# Patient Record
Sex: Male | Born: 1940 | Marital: Single
Health system: Southern US, Community
[De-identification: ages and names within clinical notes are randomized; demographics above are authoritative.]

---

## 2005-05-09 ENCOUNTER — Other Ambulatory Visit: Payer: Self-pay

## 2005-05-09 ENCOUNTER — Emergency Department: Payer: Self-pay | Admitting: Emergency Medicine

## 2009-07-28 ENCOUNTER — Ambulatory Visit: Payer: Self-pay | Admitting: Ophthalmology

## 2009-10-06 ENCOUNTER — Ambulatory Visit: Payer: Self-pay | Admitting: Ophthalmology

## 2010-04-24 ENCOUNTER — Ambulatory Visit: Payer: Self-pay | Admitting: Internal Medicine

## 2012-06-16 ENCOUNTER — Emergency Department: Payer: Self-pay | Admitting: Emergency Medicine

## 2012-06-17 LAB — COMPREHENSIVE METABOLIC PANEL
Alkaline Phosphatase: 135 U/L (ref 50–136)
Bilirubin,Total: 0.4 mg/dL (ref 0.2–1.0)
Chloride: 83 mmol/L — ABNORMAL LOW (ref 98–107)
Co2: 29 mmol/L (ref 21–32)
Creatinine: 1.18 mg/dL (ref 0.60–1.30)
EGFR (African American): >60
EGFR (Non-African Amer.): >60
Glucose: 102 mg/dL — ABNORMAL HIGH (ref 65–99)
SGOT(AST): 10 U/L — ABNORMAL LOW (ref 15–37)
Total Protein: 7.4 g/dL (ref 6.4–8.2)

## 2012-06-17 LAB — CBC
MCHC: 34 g/dL (ref 32.0–36.0)
RBC: 4.33 10*6/uL — ABNORMAL LOW (ref 4.40–5.90)
RDW: 17 % — ABNORMAL HIGH (ref 11.5–14.5)
WBC: 12.1 10*3/uL — ABNORMAL HIGH (ref 3.8–10.6)

## 2012-06-17 LAB — TROPONIN I: Troponin-I: <0.02 ng/mL

## 2012-06-17 LAB — PRO B NATRIURETIC PEPTIDE: B-Type Natriuretic Peptide: 2429 pg/mL — ABNORMAL HIGH (ref 0–125)

## 2013-04-27 ENCOUNTER — Inpatient Hospital Stay: Payer: Self-pay | Admitting: Internal Medicine

## 2013-04-27 LAB — URINALYSIS, COMPLETE
Bacteria: NONE SEEN
Bilirubin,UR: NEGATIVE
Blood: NEGATIVE
Glucose,UR: NEGATIVE mg/dL (ref 0–75)
Ketone: NEGATIVE
Leukocyte Esterase: NEGATIVE
Nitrite: NEGATIVE
Ph: 7 (ref 4.5–8.0)
Protein: NEGATIVE
RBC,UR: <1 /HPF (ref 0–5)
Specific Gravity: 1.005 (ref 1.003–1.030)
Squamous Epithelial: <1

## 2013-04-27 LAB — CBC
HCT: 38.7 % — ABNORMAL LOW (ref 40.0–52.0)
HGB: 13.1 g/dL (ref 13.0–18.0)
MCH: 31.8 pg (ref 26.0–34.0)
MCHC: 33.7 g/dL (ref 32.0–36.0)
MCV: 94 fL (ref 80–100)
Platelet: 337 10*3/uL (ref 150–440)
RBC: 4.11 10*6/uL — ABNORMAL LOW (ref 4.40–5.90)
RDW: 16.3 % — ABNORMAL HIGH (ref 11.5–14.5)
WBC: 5.1 10*3/uL (ref 3.8–10.6)

## 2013-04-27 LAB — COMPREHENSIVE METABOLIC PANEL
ALT: 23 U/L (ref 12–78)
ANION GAP: 5 — AB (ref 7–16)
Albumin: 2.9 g/dL — ABNORMAL LOW (ref 3.4–5.0)
Alkaline Phosphatase: 113 U/L
BUN: 10 mg/dL (ref 7–18)
Bilirubin,Total: 0.4 mg/dL (ref 0.2–1.0)
CALCIUM: 9.2 mg/dL (ref 8.5–10.1)
Chloride: 99 mmol/L (ref 98–107)
Co2: 28 mmol/L (ref 21–32)
Creatinine: 1.1 mg/dL (ref 0.60–1.30)
EGFR (African American): >60
EGFR (Non-African Amer.): >60
GLUCOSE: 100 mg/dL — AB (ref 65–99)
Osmolality: 264 (ref 275–301)
Potassium: 4 mmol/L (ref 3.5–5.1)
SGOT(AST): 26 U/L (ref 15–37)
SODIUM: 132 mmol/L — AB (ref 136–145)
TOTAL PROTEIN: 7 g/dL (ref 6.4–8.2)

## 2013-04-27 LAB — TROPONIN I: Troponin-I: <0.02 ng/mL

## 2013-04-27 LAB — CK TOTAL AND CKMB (NOT AT ARMC)
CK, Total: 48 U/L
CK-MB: 0.9 ng/mL (ref 0.5–3.6)

## 2013-04-27 LAB — CK-MB: CK-MB: 1.1 ng/mL (ref 0.5–3.6)

## 2013-04-27 LAB — LIPASE, BLOOD: Lipase: 145 U/L (ref 73–393)

## 2013-04-28 ENCOUNTER — Ambulatory Visit: Payer: Self-pay | Admitting: Oncology

## 2013-04-28 DIAGNOSIS — I517 Cardiomegaly: Secondary | ICD-10-CM

## 2013-04-28 LAB — PROTIME-INR
INR: 1.1
PROTHROMBIN TIME: 14.1 s (ref 11.5–14.7)

## 2013-04-28 LAB — COMPREHENSIVE METABOLIC PANEL
AST: 19 U/L (ref 15–37)
Albumin: 2.5 g/dL — ABNORMAL LOW (ref 3.4–5.0)
Alkaline Phosphatase: 96 U/L
Anion Gap: 7 (ref 7–16)
BILIRUBIN TOTAL: 0.4 mg/dL (ref 0.2–1.0)
BUN: 11 mg/dL (ref 7–18)
CALCIUM: 8.9 mg/dL (ref 8.5–10.1)
CO2: 28 mmol/L (ref 21–32)
Chloride: 100 mmol/L (ref 98–107)
Creatinine: 1.24 mg/dL (ref 0.60–1.30)
EGFR (African American): >60
GFR CALC NON AF AMER: 57 — AB
Glucose: 98 mg/dL (ref 65–99)
OSMOLALITY: 269 (ref 275–301)
Potassium: 3.9 mmol/L (ref 3.5–5.1)
SGPT (ALT): 18 U/L (ref 12–78)
SODIUM: 135 mmol/L — AB (ref 136–145)
Total Protein: 6.5 g/dL (ref 6.4–8.2)

## 2013-04-28 LAB — CBC WITH DIFFERENTIAL/PLATELET
BASOS PCT: 0.4 %
Basophil #: 0 10*3/uL (ref 0.0–0.1)
Eosinophil #: 0 10*3/uL (ref 0.0–0.7)
Eosinophil %: 0.3 %
HCT: 37.1 % — ABNORMAL LOW (ref 40.0–52.0)
HGB: 12 g/dL — ABNORMAL LOW (ref 13.0–18.0)
LYMPHS ABS: 1.5 10*3/uL (ref 1.0–3.6)
Lymphocyte %: 21.9 %
MCH: 30.5 pg (ref 26.0–34.0)
MCHC: 32.4 g/dL (ref 32.0–36.0)
MCV: 94 fL (ref 80–100)
MONOS PCT: 35.7 %
Monocyte #: 2.5 x10 3/mm — ABNORMAL HIGH (ref 0.2–1.0)
NEUTROS ABS: 2.9 10*3/uL (ref 1.4–6.5)
Neutrophil %: 41.7 %
PLATELETS: 349 10*3/uL (ref 150–440)
RBC: 3.94 10*6/uL — AB (ref 4.40–5.90)
RDW: 16.9 % — ABNORMAL HIGH (ref 11.5–14.5)
WBC: 6.9 10*3/uL (ref 3.8–10.6)

## 2013-04-28 LAB — PRO B NATRIURETIC PEPTIDE: B-TYPE NATIURETIC PEPTID: 936 pg/mL — AB (ref 0–125)

## 2013-05-04 ENCOUNTER — Ambulatory Visit: Payer: Self-pay | Admitting: Oncology

## 2013-10-04 DEATH — deceased

## 2014-06-27 NOTE — Consult Note (Signed)
History of Present Illness:  Reason for Consult Stage IV lung cancer, actively receiving chemotherapy at the [place] in [place].   HPI   Patient is a 74 year old male with past medical history significant for stage IV lung cancer was actively receiving chemotherapy. He presented to the with a several day history of right flank pain. Subsequent CT suggested cholecystitis. Given his multiple comorbidities, it was elected that he be treated with conservative management. Currently, he feels improved and nearly back to his baseline. He has no neurologic complaints. He denies any fevers. He denies any chest pain or shortness of breath. He denies any further abdominal pain, and denies any nausea, vomiting, constipation, or diarrhea. He has no urinary complaints. Patient otherwise feels well and offers no further specific complaints.  PFSH:  Additional Past Medical and Surgical History stage IV lung cancer, CAD status post MI, COPD, hyperlipidemia, hypertension, BPH, traumatic left BKA.  Social history: Remote tobacco, denies alcohol.  Family history: CVA, CAD, hypertension.   Review of Systems:  Performance Status (ECOG) 2   Review of Systems   As per history of present illness, otherwise a 10 system review is negative.  NURSING NOTES: **Vital Signs.:   23-Feb-15 15:49   Vital Signs Type: Q 4hr   Temperature Temperature (F): 98   Celsius: 36.6   Temperature Source: oral   Pulse Pulse: 95   Respirations Respirations: 20   Systolic BP Systolic BP: 93   Diastolic BP (mmHg) Diastolic BP (mmHg): 60   Mean BP: 71   Pulse Ox % Pulse Ox %: 89   Pulse Ox Activity Level: At rest   Oxygen Delivery: 2L   Physical Exam:  Physical Exam General: Well-developed, well-nourished, no acute distress. Eyes: Pink conjunctiva, anicteric sclera. HEENT: Normocephalic, moist mucous membranes, clear oropharnyx. Lungs: Clear to auscultation bilaterally. Heart: Regular rate and rhythm. No rubs,  murmurs, or gallops. Abdomen: Soft, nontender, nondistended. No organomegaly noted, normoactive bowel sounds. Musculoskeletal: left BKA Neuro: Alert, answering all questions appropriately. Cranial nerves grossly intact. Skin: No rashes or petechiae noted. Psych: Normal affect. Lymphatics: No cervical, calvicular, axillary or inguinal LAD.    No Known Allergies:     amLODIPine 10 mg oral tablet: 1 tab(s) orally once a day, Status: Active, Quantity: 0, Refills: None   Metoprolol Tartrate 50 mg oral tablet: 1 tab(s) orally 2 times a day, Status: Active, Quantity: 0, Refills: None   lisinopril 40 mg oral tablet: 1 tab(s) orally once a day, Status: Active, Quantity: 0, Refills: None   gabapentin 100 mg oral capsule: 1 cap(s) orally 3 times a day as needed for nerve pain., Status: Active, Quantity: 0, Refills: None   tamsulosin 0.4 mg oral capsule: 1 cap(s) orally once a day, Status: Active, Quantity: 0, Refills: None   omeprazole 20 mg oral delayed release capsule: 2 caps (45m) orally once a day to control stomach acid 30 minutes before a meal., Status: Active, Quantity: 0, Refills: None   simvastatin 40 mg oral tablet: 0.5 tab (26m orally once a day (at bedtime), Status: Active, Quantity: 0, Refills: None  Laboratory Results:  Hepatic:  23-Feb-15 03:57   Bilirubin, Total 0.4  Alkaline Phosphatase 96 (45-117 NOTE: New Reference Range 01/24/13)  SGPT (ALT) 18  SGOT (AST) 19  Total Protein, Serum 6.5  Albumin, Serum  2.5  Routine Chem:  23-Feb-15 03:57   B-Type Natriuretic Peptide ([facility])  936 (Result(s) reported on 28 Apr 2013 at 05:06AM.)  Result Comment MONO # - CONFIRMED BY MANUAL  DIFFERENTIAL  Result(s) reported on 28 Apr 2013 at 05:43AM.  Glucose, Serum 98  BUN 11  Creatinine (comp) 1.24  Sodium, Serum  135  Potassium, Serum 3.9  Chloride, Serum 100  CO2, Serum 28  Calcium (Total), Serum 8.9  Osmolality (calc) 269  eGFR (African American) >60  eGFR (Non-African  American)  57 (eGFR values <31m/min/1.73 m2 may be an indication of chronic kidney disease (CKD). Calculated eGFR is useful in patients with stable renal function. The eGFR calculation will not be reliable in acutely ill patients when serum creatinine is changing rapidly. It is not useful in  patients on dialysis. The eGFR calculation may not be applicable to patients at the low and high extremes of body sizes, pregnant women, and vegetarians.)  Anion Gap 7  Routine Coag:  23-Feb-15 03:57   Prothrombin 14.1  INR 1.1 (INR reference interval applies to patients on anticoagulant therapy. A single INR therapeutic range for coumarins is not optimal for all indications; however, the suggested range for most indications is 2.0 - 3.0. Exceptions to the INR Reference Range may include: Prosthetic heart valves, acute myocardial infarction, prevention of myocardial infarction, and combinations of aspirin and anticoagulant. The need for a higher or lower target INR must be assessed individually. Reference: The Pharmacology and Management of the Vitamin K  antagonists: the seventh ACCP Conference on Antithrombotic and Thrombolytic Therapy. CYEBXI.3568Sept:126 (3suppl): [id number] A HCT value >55% may artifactually increase the PT.  In one study,  the increase was an average of 25%. Reference:  "Effect on Routine and Special Coagulation Testing Values of Citrate Anticoagulant Adjustment in Patients with High HCT Values." American Journal of Clinical Pathology 2006;126:400-405.)  Routine Hem:  23-Feb-15 03:57   WBC (CBC) 6.9  RBC (CBC)  3.94  Hemoglobin (CBC)  12.0  Hematocrit (CBC)  37.1  Platelet Count (CBC) 349  MCV 94  MCH 30.5  MCHC 32.4  RDW  16.9  Neutrophil % 41.7  Lymphocyte % 21.9  Monocyte % 35.7  Eosinophil % 0.3  Basophil % 0.4  Neutrophil # 2.9  Lymphocyte # 1.5  Monocyte #  2.5  Eosinophil # 0.0  Basophil # 0.0   Assessment and Plan: Impression:   Stage IV lung  cancer on chemotherapy, now with cholecystitis. Plan:   1. Stage IV lung cancer: Patient states he received single agent treatment approximately 2 weeks ago, but is unclear as to what he is receiving. Patient states his next treatment is in approximately one week. He has been instructed to keep thisappointment as scheduled, but is unlikely he will be able to receive treatment at this visit. Will defer that decision to [facility] No intervention is needed at this time.Cholecystitis: Given the palliative nature of treatment, agree with conservative management. consult, call questions.  Electronic Signatures: FDelight Hoh(MD)  (Signed [id number])  Authored: HISTORY OF PRESENT ILLNESS, PFSH, ROS, NURSING NOTES, PE, ALLERGIES, HOME MEDICATIONS, LABS, ASSESSMENT AND PLAN   Last Updated: 24-Feb-15 08:50 by FDelight Hoh(MD)

## 2014-06-27 NOTE — Consult Note (Signed)
PATIENT NAME:  Jordan Hurst MR#:  [id number] DATE OF BIRTH:  [birth year]  DATE OF CONSULTATION:  04/27/2013  REFERRING PHYSICIAN:   CONSULTING PHYSICIAN:  Carmie Endalph L. Ely III, MD  [facility].   ADMITTING DIAGNOSIS: Respiratory insufficiency.   SURGICAL DIAGNOSIS: Abdominal pain, possible cholecystitis.   ADMITTING PHYSICIAN: [facility].    BRIEF HISTORY: Jordan Hurst is a 74 year old gentleman admitted through the Emergency Room with abdominal right upper quadrant, right chest pain. The patient has stage IV lung cancer, currently [facility]. He also has a history of significant chronic obstructive lung disease and coronary artery disease. He has noted some increasing discomfort in the upper abdomen and right chest area. He said the pain has been going on for several days, dating back to his last chemotherapy treatment last week in [place]. He said it worsened over the last 24 hours. He has not had any significant vomiting, although he has been mildly nauseated. He is not sure whether the nausea is related to his chemotherapy or to his abdominal and chest discomfort. He has had multiple similar episodes in the past which have not been as severe. He has not had an episode with the symptoms that he has had currently. Denies any other significant GI problems. He denies a history of hepatitis, yellow jaundice, pancreatitis, peptic ulcer disease, previous diagnosis of gallbladder disease or diverticulitis. He does have a history of atherosclerotic coronary artery disease with a previous MI approximately 8 to 10 years ago. He also has a history of chronic obstructive lung disease, hypertension, hyperlipidemia. Surgical history is significant for a traumatic left below-knee amputation. Denies any abdominal surgery. The remainder of his workup is well outlined in his medical admission note.   He does not smoke  cigarettes currently but does have a history of previous tobacco usage. He does not have any history of alcohol problems.   REVIEW OF SYSTEMS: Outlined in his medical note.   FAMILY HISTORY: Noncontributory.   PHYSICAL EXAMINATION:  GENERAL: He seems alert, relatively comfortable, although he had recently received some sedation just prior to my interview.  VITAL SIGNS: He is afebrile. His blood pressure is 130/70. Heart rate is 80 and regular. His oxygen saturation is stable on 2 liters at 95%.  HEENT: No scleral icterus. No pupillary abnormalities.  NECK: Supple, nontender with a midline trachea. No adenopathy.  CHEST: Clear with no adventitious sounds. He has normal pulmonary excursion.  CARDIAC: No murmurs or gallops. He seems to be in normal sinus rhythm. He has very distant cardiac sounds.  ABDOMEN: Mildly tender in left upper quadrant with some guarding but no rebound. I cannot palpate his gallbladder. The abdomen is otherwise soft, mildly distended.  EXTREMITIES: Surgically absent left leg. He does not have good distal pulses on the right side.  PSYCHIATRIC: Some mild sedation but relatively normal orientation.   RADIOGRAPHS: I independently reviewed his CT scan. CT scan does demonstrate some mild gallbladder wall thickening and a little bit of pericholecystic fluid consistent with a possible early cholecystitis, with his ultrasound demonstrating stones.   ASSESSMENT AND PLAN: He is a very poor surgical candidate with his underlying stage IV lung cancer. We would recommend continued intravenous antibiotic therapy and further observation. He may be a candidate for percutaneous cholecystostomy tube if his symptoms do not resolve. We will continue to follow him while he is hospitalized.   ____________________________ Quentin Orealph L. Ely III, MD rle:gb Hurst: 04/27/2013 20:41:18  ET T: 04/28/2013 01:45:18 ET JOB#: [id number]  cc: Carmie End, MD, <Dictator> Quentin Ore MD ELECTRONICALLY SIGNED  05/07/2013 7:37

## 2014-06-27 NOTE — Discharge Summary (Signed)
PATIENT NAME:  Jordan Hurst, Jordan Hurst MR#:  [id number] DATE OF BIRTH:  1940/06/07  DATE OF ADMISSION:  04/27/2013  DATE OF DISCHARGE:  04/29/2013  DISCHARGE DIAGNOSES: 1.  Acute mild cholecystitis, medical management recommended, improving on antibiotics.  2.  [facility]  3.  Acute respiratory failure, now resolved.  4.  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 Echo showing normal LV function.  5.  Chronic obstructive pulmonary disease exacerbation, improving.  6.  Hyponatremia, resolved, likely due to poor p.o. intake.   SECONDARY DIAGNOSES:  1.  Stage IV lung cancer, on chemotherapy.  2.  Atherosclerotic cardiovascular disease, status post myocardial infarction. 3.  Chronic obstructive pulmonary disease. 4.  Hyperlipidemia.  5.  Hypertension.  6.  Benign prostatic hypertrophy.   CONSULTATIONS:  1.  Surgery, Dr. Michela Pitcher. 2.  Palliative Care, Dr. Harvie Junior.  3.  Oncology, Dr. Orlie Dakin.   PROCEDURES AND RADIOLOGY: A 2-D echocardiogram on February 23 showed normal LVEF, with a value of 55% to 60%. Impaired relaxation pattern of LV diastolic filling. Mild concentric LVH. Mild aortic valve sclerosis without stenosis.   Chest x-ray on February 23 showed patchy density in the left lower lobe, likely residual scarring.   Chest x-ray on February 22 showed cardiomegaly with vascular congestion.   CT scan of the abdomen and pelvis with contrast on February 22 showed cholelithiasis, mild acute cholecystitis, diffuse hepatic steatosis, moderately large hiatal hernia. Descending and sigmoid colon diverticulosis, without evidence of acute diverticulitis. Mild to moderate prostate gland, right inguinal hernia containing fat. A 3 cm mass in the left lower lobe of the lung. No evidence of metastatic disease in the abdomen or pelvis.   Abdominal ultrasound on February 22 showed gallstone and mild pericholecystic fluid.   MAJOR LABORATORY PANEL: UA on  admission was negative.   HISTORY AND SHORT HOSPITAL COURSE: The patient is a 74 year old male with above-mentioned medical problems, who was admitted for mild acute cholecystitis. He was also found to have acute respiratory failure, thought to be secondary to acute on chronic diastolic heart failure. Please see Dr. Margaretmary Eddy dictated history and physical for further details. The patient had some hyponatremia, which was thought to be secondary to poor p.o. intake, and was resolved with IV hydration. Surgical consultation was obtained with Dr. Michela Pitcher, who recommended medical management considering patient's poor clinical status with advanced malignancy. He would be a very poor surgical candidate, and recommended just a course of antibiotic, which patient was in agreement. Palliative Care consultation was obtained with Dr. Harvie Junior, who was also recommending same. Oncology consultation was obtained with Dr. Orlie Dakin, who recommended outpatient followup with patient's oncologist at [facility] for his stage IV lung cancer. The patient was feeling much better, and was close to his baseline on February 24, and was discharged home in stable condition. On the date of discharge, his vital signs were as follows: Temperature 97.6, heart rate 92 per minute, respirations 18 per minute, blood pressure 132/69 mmHg, he was saturating 98% on room air.   PERTINENT PHYSICAL EXAMINATION ON THE DATE OF DISCHARGE:  CARDIOVASCULAR: S1, S2 normal. No murmur, rubs, or gallop.  LUNGS: Clear to auscultation bilaterally. No wheezing, rales, rhonchi, or crepitation.  ABDOMEN: Soft, benign.  NEUROLOGIC: Nonfocal examination.  All other physical examination remained at baseline.  DISCHARGE MEDICATIONS: 1.  Amlodipine 10 mg p.o. daily.  2.  Metoprolol 50 mg p.o. b.i.d.  3.  Lisinopril 40 mg p.o. daily. 4.  Gabapentin 100 mg p.o. 3 times a day as needed.  5.  Tamsulosin 0.4 mg p.o. daily. 6.  Omeprazole 40 mg p.o. daily.  7.  Levaquin 500 mg p.o.  daily for 7 days.  8.  Flagyl 500 mg p.o. 3 times a day for 7 days.   DISCHARGE DIET: Low sodium.   DISCHARGE ACTIVITY: As tolerated.   DISCHARGE INSTRUCTIONS AND FOLLOWUP:  The patient was instructed to follow up with [facility] in 2 to 4 weeks. He will need follow up with his oncology [facility] in 1 to 2 weeks as scheduled for following week after discharge. He will need [facility] in 4 to 6 weeks.   Total time discharging this patient was 45 minutes.    ____________________________ Ellamae SiaVipul S. Sherryll BurgerShah, MD vss:mr D: 05/03/2013 17:12:00 ET T: 05/03/2013 18:05:23 ET JOB#: [id number]  cc: [facility] Quentin Orealph L. Ely III, MD Ned GraceNancy Phifer, MD Tollie Pizzaimothy J. Orlie DakinFinnegan, MD Thinh Cuccaro S. Sherryll BurgerShah, MD, <Dictator>      Ellamae SiaVIPUL S [facility] MD ELECTRONICALLY SIGNED 05/07/2013 21:23

## 2014-06-27 NOTE — H&P (Signed)
PATIENT NAME:  [name], Park D MR#:  [id number] DATE OF BIRTH:  [birth year]  DATE OF ADMISSION:  04/27/2013  REFERRING PHYSICIAN: Darien Ramusavid W. Kaminski, MD  [facility].   REASON FOR ADMISSION: Acute respiratory failure.   HISTORY OF PRESENT ILLNESS: The patient is a [age] year old male [facility] with stage 4 lung cancer, undergoing chemotherapy. He also has a history of coronary artery disease, COPD and traumatic left BKA.  Presents to the Emergency Room with a 1 to 2-day history of right-sided upper abdominal and right lower chest pain. He states he has a pressure sensation in his right upper abdomen. Has had some diarrhea. In the Emergency Room, the patient was noted to be hypoxic. CT of the abdomen suggested cholecystitis. Chest x-ray suggested heart failure. The patient denies history of heart failure although he has had previous MI. The patient continued to have significant right-sided abdominal pain in the Emergency Room and is now admitted for further evaluation.   PAST MEDICAL HISTORY: 1.  Stage 4 lung cancer, on chemotherapy.  2.  ASCVD, status MI. 3.  COPD.   4.  Hyperlipidemia.  5.  Benign hypertension.  6.  BPH.   7.  Traumatic left BKA.    MEDICATIONS: 1.  Zocor 40 mg p.o. daily.  2.  Omeprazole 40 mg p.o. daily.  3.  Lopressor 50 mg p.o. b.i.d.  4.  Zestril 40 mg p.o. daily.  5.  Neurontin 300 mg p.o. at bedtime.  6.  Proscar 5 mg p.o. daily.  7.  Norvasc 10 mg p.o. daily.   ALLERGIES: No known drug allergies.   SOCIAL HISTORY: The patient has a remote history of tobacco abuse but none recently. Denies alcohol abuse.   FAMILY HISTORY: Positive for stroke, coronary artery disease and hypertension. Negative for colon or prostate cancer.   REVIEW OF SYSTEMS: CONSTITUTIONAL: No fever or change in weight.  EYES: No blurred or double vision. No glaucoma.  ENT: No tinnitus or hearing loss. No nasal discharge or bleeding. No difficulty  swallowing.  RESPIRATORY: The patient has had cough and wheezing. No hemoptysis. No painful respiration.  CARDIOVASCULAR: No chest pain or orthopnea. No palpitations or syncope.  GASTROINTESTINAL: Denies nausea, vomiting although he has had diarrhea. Does have abdominal pain in the right upper quadrant.  GENITOURINARY: No dysuria or hematuria. No incontinence.  ENDOCRINE: No polyuria or polydipsia. No heat or cold intolerance.  HEMATOLOGIC: The patient denies anemia, easy bruising or bleeding.  LYMPHATIC: No swollen glands.  MUSCULOSKELETAL: The patient denies pain in his neck, shoulders, knees or hips. No gout.  NEUROLOGIC: No numbness or migraines. Denies stroke or seizures.  PSYCHIATRIC: The patient denies anxiety, insomnia or depression.   PHYSICAL EXAMINATION: GENERAL: The patient is chronically ill-appearing, in no acute distress.  VITAL SIGNS: Currently remarkable for a blood pressure of 108/59 with a heart rate of 79, respiratory rate of 25, temperature of 98.7. Sat was 88% on room air and is 95% on 2 liters.  HEENT: Normocephalic, atraumatic. Pupils equally round and reactive to light and accommodation. Extraocular movements are intact. Sclerae are anicteric. Conjunctivae are clear. Oropharynx is clear.   NECK: Supple without JVD. No adenopathy or thyromegaly is noted.  LUNGS: Revealed decreased breath sounds with basilar crackles. Scattered rhonchi. No wheezes. No dullness. Respiratory effort is normal.  CARDIAC: Regular rate and rhythm. Normal S1, S2. No significant rubs, murmurs or gallops. PMI is nondisplaced. Chest wall is nontender.  ABDOMEN: Distended and  tender in the right upper quadrant. Some guarding but no rebound. Normoactive bowel sounds. No organomegaly or masses were appreciated.  EXTREMITIES: Without clubbing, cyanosis or edema. Left BKA was noted. Pulses on the right were 2+ distally.  SKIN: Warm and dry without rash or lesions.  NEUROLOGIC: Cranial nerves II through  XII grossly intact. Deep tendon reflexes were symmetric. Motor and sensory examination is nonfocal.  PSYCHIATRIC: The patient who is alert and oriented to person, place, and time. He was cooperative and used good judgment.   LABORATORY, DIAGNOSTIC AND RADIOLOGICAL DATA:  EKG revealed normal sinus rhythm with an old inferior infarct. Chest x-ray revealed cardiomegaly with mild vascular congestion but no edema. CT of the abdomen and pelvis revealed cholelithiasis with CT findings consistent with early acute cholecystitis, also noted was a 3 cm mass in the left lower lung lobe. Glucose 100 with a BUN of 10, creatinine 1.10 and a sodium of 132 with a GFR of greater than 60. Troponin x 2 was less than 0.02. White count was 5.1 with a hemoglobin of 13.1.   ASSESSMENT: 1.  Acute cholecystitis.  2.  Stage 4 lung cancer.  3.  Acute respiratory failure.  4.  Mild congestive heart failure.  5.  Chronic obstructive pulmonary disease exacerbation.  6.  Hyponatremia.  7.  History of atherosclerotic cardiovascular disease, status post myocardial infarction.   PLAN: The patient will be admitted to telemetry as a no CODE BLUE, DO NOT RESUSCITATE, according to his own wishes. He will be started on IV antibiotics with oxygen, DuoNeb SVNs and given IV Lasix x 1. We will follow through serial cardiac enzymes and obtain an echocardiogram. We will follow up chest x-ray and routine labs in the morning. We will consult surgery for his cholecystitis. Will consult oncology for his lung cancer. We will consult pulmonology for his COPD and respiratory failure. Clear liquid diet for now. Further treatment and evaluation will depend upon the patient's progress.   TOTAL TIME SPENT ON THIS PATIENT: 50 minutes.    ____________________________ Duane Lope Judithann Sheen, MD jds:cs D: 04/27/2013 18:08:35 ET T: 04/27/2013 18:29:24 ET JOB#: [id number]  cc: Duane Lope. Judithann Sheen, MD, <Dictator> Emonni Depasquale Rodena Medin MD ELECTRONICALLY SIGNED  04/28/2013 12:01

## 2014-11-21 IMAGING — CR DG CHEST 2V
1 series · 2 of 2 positions shown · non-contrast
Comparison: 04/27/2013.  06/17/2012.

CLINICAL DATA: Shortness of breath

EXAM:
CHEST  2 VIEW

[Series 1: x chest ap · 0.14mm/px · 2 of 2 slices shown]
[im 1/2]
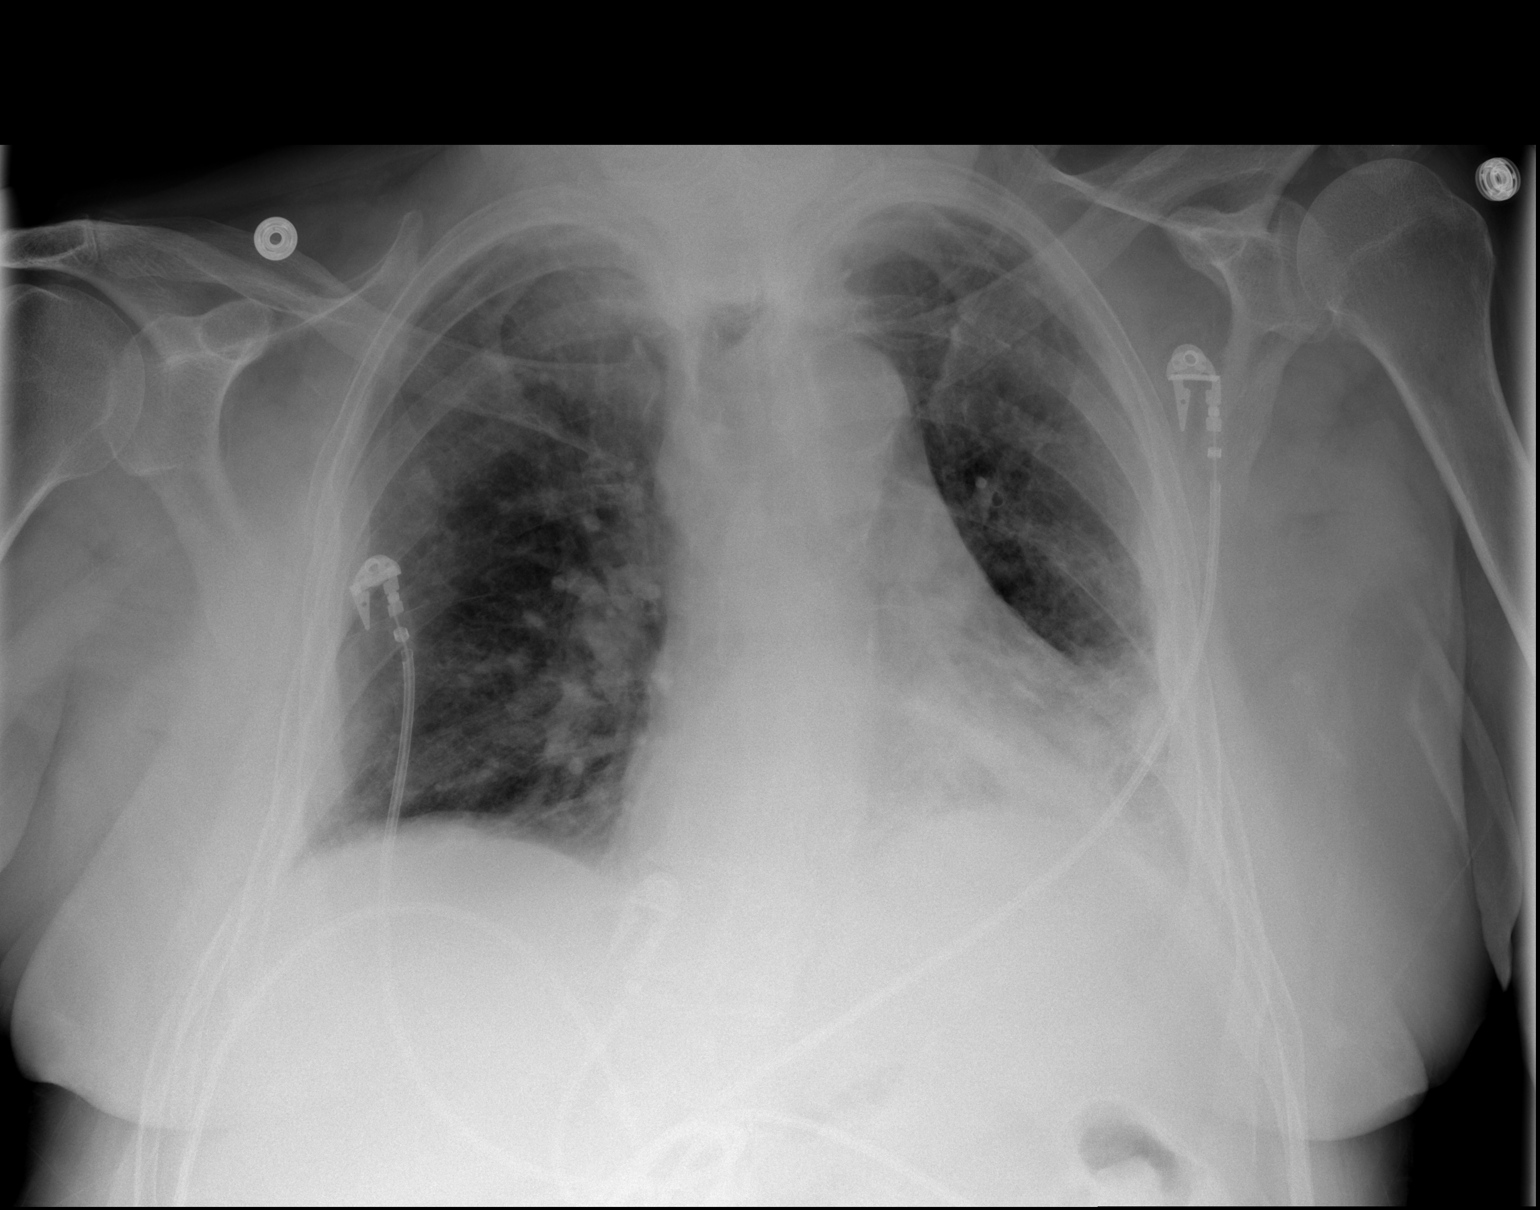
[im 2/2]
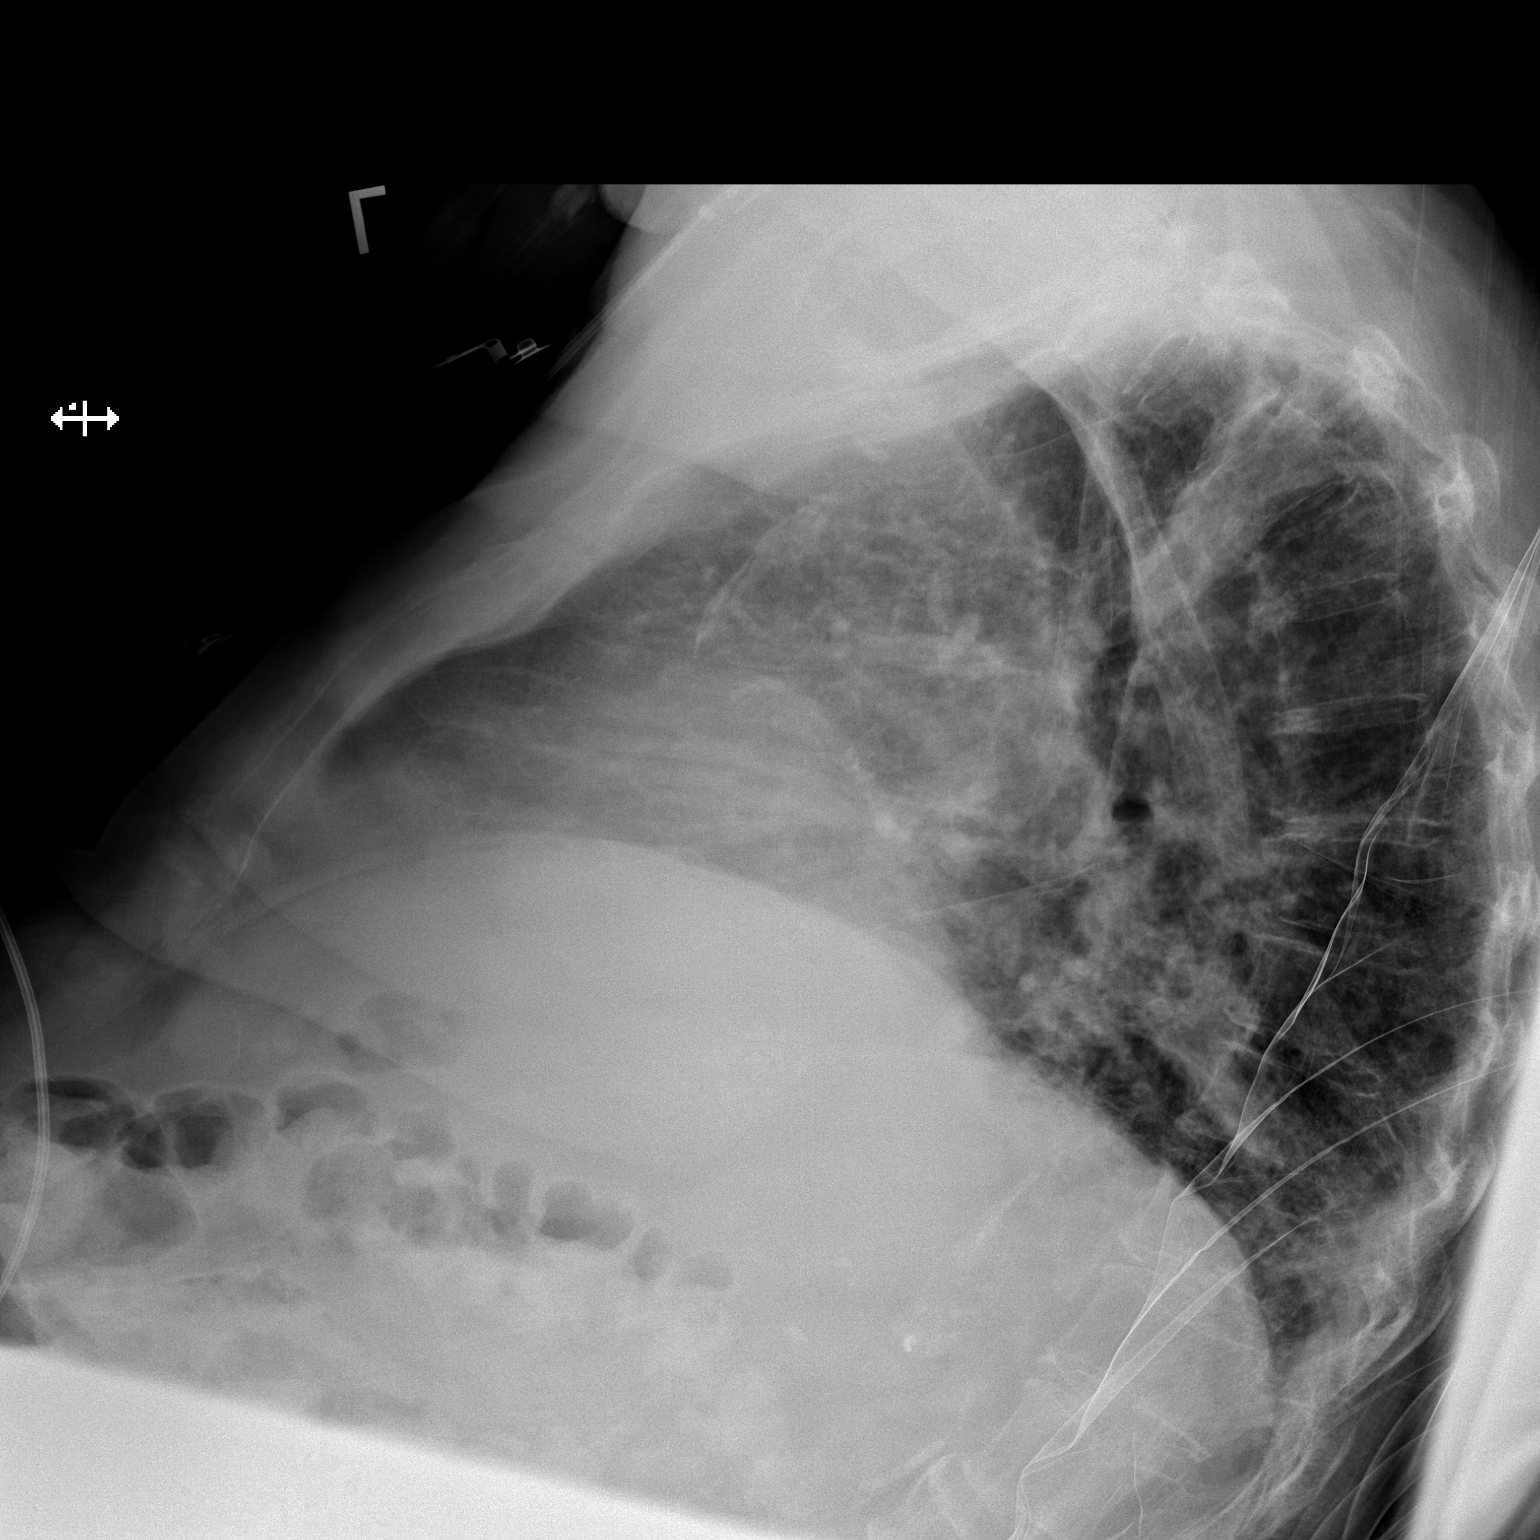

[2 of 2 positions shown; findings below may reference images not displayed]

FINDINGS: Heart size is at the upper limits of normal. There is calcification
of the thoracic aorta of. The right lung is clear. There is patchy
density at the left base. Some of this may represent scarring
related to previous consolidation in Monday June, 2012. However, there
could be some active inflammation in this region. No effusion.
Ordinary degenerative changes affect the spine.
IMPRESSION: Patchy density in the left lower lobe. This could represent residual
scarring from consolidation previously seen in June 2012. However,
the possibility of active left base infiltrate can't be excluded
given this combination of factors.
